# Patient Record
Sex: Male | Born: 1970 | Race: White | Hispanic: No | Marital: Married | Smoking: Never smoker
Health system: Southern US, Community
[De-identification: ages and names within clinical notes are randomized; demographics above are authoritative.]

## PROBLEM LIST (undated history)

## (undated) DIAGNOSIS — K219 Gastro-esophageal reflux disease without esophagitis: Secondary | ICD-10-CM

## (undated) DIAGNOSIS — I10 Essential (primary) hypertension: Secondary | ICD-10-CM

## (undated) DIAGNOSIS — T7840XA Allergy, unspecified, initial encounter: Secondary | ICD-10-CM

## (undated) HISTORY — DX: Allergy, unspecified, initial encounter: T78.40XA

## (undated) HISTORY — PX: LASIK: SHX215

## (undated) HISTORY — PX: VASECTOMY: SHX75

## (undated) HISTORY — DX: Gastro-esophageal reflux disease without esophagitis: K21.9

## (undated) HISTORY — PX: TONSILLECTOMY: SUR1361

## (undated) HISTORY — DX: Essential (primary) hypertension: I10

---

## 2016-11-09 ENCOUNTER — Telehealth: Payer: Self-pay | Admitting: Nurse Practitioner

## 2016-11-09 NOTE — Telephone Encounter (Signed)
Routed message to MMM to approve.

## 2016-11-09 NOTE — Telephone Encounter (Signed)
Appt made to be seen for New Patient/CPE per MMM

## 2016-11-09 NOTE — Telephone Encounter (Signed)
Yes ok 

## 2016-11-09 NOTE — Telephone Encounter (Signed)
Yes ok to make appointment for husband

## 2016-11-22 ENCOUNTER — Encounter: Payer: Self-pay | Admitting: Nurse Practitioner

## 2016-11-22 ENCOUNTER — Ambulatory Visit (INDEPENDENT_AMBULATORY_CARE_PROVIDER_SITE_OTHER): Payer: Managed Care, Other (non HMO) | Admitting: Nurse Practitioner

## 2016-11-22 ENCOUNTER — Ambulatory Visit (INDEPENDENT_AMBULATORY_CARE_PROVIDER_SITE_OTHER): Payer: Managed Care, Other (non HMO)

## 2016-11-22 ENCOUNTER — Other Ambulatory Visit: Payer: Self-pay | Admitting: Nurse Practitioner

## 2016-11-22 VITALS — BP 144/89 | HR 59 | Temp 97.4°F | Ht 71.0 in | Wt 195.0 lb

## 2016-11-22 DIAGNOSIS — Z23 Encounter for immunization: Secondary | ICD-10-CM

## 2016-11-22 DIAGNOSIS — Z0000 Encounter for general adult medical examination without abnormal findings: Secondary | ICD-10-CM

## 2016-11-22 DIAGNOSIS — I10 Essential (primary) hypertension: Secondary | ICD-10-CM | POA: Diagnosis not present

## 2016-11-22 DIAGNOSIS — Z8639 Personal history of other endocrine, nutritional and metabolic disease: Secondary | ICD-10-CM

## 2016-11-22 NOTE — Addendum Note (Signed)
Addended by: Cleda Daub on: 11/22/2016 03:06 PM   Modules accepted: Orders

## 2016-11-22 NOTE — Patient Instructions (Signed)
Hypertension °Hypertension is another name for high blood pressure. High blood pressure forces your heart to work harder to pump blood. This can cause problems over time. °There are two numbers in a blood pressure reading. There is a top number (systolic) over a bottom number (diastolic). It is best to have a blood pressure below 120/80. Healthy choices can help lower your blood pressure. You may need medicine to help lower your blood pressure if: °· Your blood pressure cannot be lowered with healthy choices. °· Your blood pressure is higher than 130/80. °Follow these instructions at home: °Eating and drinking  °· If directed, follow the DASH eating plan. This diet includes: °¨ Filling half of your plate at each meal with fruits and vegetables. °¨ Filling one quarter of your plate at each meal with whole grains. Whole grains include whole wheat pasta, brown rice, and whole grain bread. °¨ Eating or drinking low-fat dairy products, such as skim milk or low-fat yogurt. °¨ Filling one quarter of your plate at each meal with low-fat (lean) proteins. Low-fat proteins include fish, skinless chicken, eggs, beans, and tofu. °¨ Avoiding fatty meat, cured and processed meat, or chicken with skin. °¨ Avoiding premade or processed food. °· Eat less than 1,500 mg of salt (sodium) a day. °· Limit alcohol use to no more than 1 drink a day for nonpregnant women and 2 drinks a day for men. One drink equals 12 oz of beer, 5 oz of wine, or 1½ oz of hard liquor. °Lifestyle  °· Work with your doctor to stay at a healthy weight or to lose weight. Ask your doctor what the best weight is for you. °· Get at least 30 minutes of exercise that causes your heart to beat faster (aerobic exercise) most days of the week. This may include walking, swimming, or biking. °· Get at least 30 minutes of exercise that strengthens your muscles (resistance exercise) at least 3 days a week. This may include lifting weights or pilates. °· Do not use any  products that contain nicotine or tobacco. This includes cigarettes and e-cigarettes. If you need help quitting, ask your doctor. °· Check your blood pressure at home as told by your doctor. °· Keep all follow-up visits as told by your doctor. This is important. °Medicines  °· Take over-the-counter and prescription medicines only as told by your doctor. Follow directions carefully. °· Do not skip doses of blood pressure medicine. The medicine does not work as well if you skip doses. Skipping doses also puts you at risk for problems. °· Ask your doctor about side effects or reactions to medicines that you should watch for. °Contact a doctor if: °· You think you are having a reaction to the medicine you are taking. °· You have headaches that keep coming back (recurring). °· You feel dizzy. °· You have swelling in your ankles. °· You have trouble with your vision. °Get help right away if: °· You get a very bad headache. °· You start to feel confused. °· You feel weak or numb. °· You feel faint. °· You get very bad pain in your: °¨ Chest. °¨ Belly (abdomen). °· You throw up (vomit) more than once. °· You have trouble breathing. °Summary °· Hypertension is another name for high blood pressure. °· Making healthy choices can help lower blood pressure. If your blood pressure cannot be controlled with healthy choices, you may need to take medicine. °This information is not intended to replace advice given to you by your   health care provider. Make sure you discuss any questions you have with your health care provider. °Document Released: 12/28/2007 Document Revised: 06/08/2016 Document Reviewed: 06/08/2016 °Elsevier Interactive Patient Education © 2017 Elsevier Inc. ° °

## 2016-11-22 NOTE — Progress Notes (Addendum)
Subjective:    Patient ID: Kien Mirsky, male    DOB: Jun 18, 1971, 46 y.o.   MRN: [id number]  HPI Patient comes in today to establish care and annual physical. It has been several years since he has had a physical with blood work. He is very active and works out either [employer] or doing cardio everyday. He currently has no medical problems and is on no meds. C/O sharp chest pain couple of weeks ago- was not active at moment- lasted less then 1 minute- no SOB.  Has history of vitamin d def and takes multivitamin with vitamn d daily. Family History  Problem Relation Age of Onset  . Heart disease Mother   . Hyperlipidemia Mother   . Hypertension Mother   . Kidney disease Mother   . Heart disease Father   . Cancer Father   . Hyperlipidemia Father   . Diabetes Sister   . Hyperlipidemia Brother    Past Surgical History:  Procedure Laterality Date  . VASECTOMY     Social History   Social History  . Marital status: Married    Spouse name: N/A  . Number of children: N/A  . Years of education: N/A   Occupational History  . Not on file.   Social History Main Topics  . Smoking status: Never Smoker  . Smokeless tobacco: Never Used  . Alcohol use 7.2 oz/week    12 Cans of beer per week     Comment: weekends only  . Drug use: Unknown  . Sexual activity: Yes    Birth control/ protection: Surgical     Comment: vasectomy   Other Topics Concern  . Not on file   Social History Narrative  . No narrative on file     Review of Systems  Constitutional: Negative for diaphoresis.  Eyes: Negative for pain.  Respiratory: Negative for shortness of breath.   Cardiovascular: Negative for chest pain, palpitations and leg swelling.  Gastrointestinal: Negative for abdominal pain.  Endocrine: Negative for polydipsia.  Skin: Negative for rash.  Neurological: Negative for dizziness, weakness and headaches.  Hematological: Does not bruise/bleed easily.       Objective:   Physical Exam  Constitutional: He is oriented to person, place, and time. He appears well-developed and well-nourished.  HENT:  Head: Normocephalic.  Right Ear: External ear normal.  Left Ear: External ear normal.  Nose: Nose normal.  Mouth/Throat: Oropharynx is clear and moist.  Eyes: EOM are normal. Pupils are equal, round, and reactive to light.  Neck: Normal range of motion. Neck supple. No JVD present. No thyromegaly present.  Cardiovascular: Normal rate, regular rhythm, normal heart sounds and intact distal pulses.  Exam reveals no gallop and no friction rub.   No murmur heard. Pulmonary/Chest: Effort normal and breath sounds normal. No respiratory distress. He has no wheezes. He has no rales. He exhibits no tenderness.  Abdominal: Soft. Bowel sounds are normal. He exhibits no mass. There is no tenderness.  Genitourinary: Prostate normal and penis normal.  Musculoskeletal: Normal range of motion. He exhibits no edema.  Lymphadenopathy:    He has no cervical adenopathy.  Neurological: He is alert and oriented to person, place, and time. No cranial nerve deficit.  Skin: Skin is warm and dry.  Psychiatric: He has a normal mood and affect. His behavior is normal. Judgment and thought content normal.   BP (!) 144/89   Pulse (!) 59   Temp 97.4 F (36.3 C) (Oral)   Ht 5'  11" (1.803 m)   Wt 195 lb (88.5 kg)   BMI 27.20 kg/m    EKG- sinus bradycardia-Mary-Margaret Daphine Deutscher, FNP Chest x ray-    Assessment & Plan:   1. Annual physical exam    Labs pending Continue to watch diet and exercise Labs pending Wife will get blood pressure cuff and keep diary of blood pressure and will let me know next week what blood pressure is doing Needs eye exam RTO in 1 year- sooner if blood pressure is elevated at home  Mary-Margaret Daphine Deutscher, FNP

## 2016-11-23 LAB — CBC WITH DIFFERENTIAL/PLATELET
BASOS: 0 %
Basophils Absolute: 0 10*3/uL (ref 0.0–0.2)
EOS (ABSOLUTE): 0.1 10*3/uL (ref 0.0–0.4)
Eos: 2 %
Hematocrit: 45.1 % (ref 37.5–51.0)
Hemoglobin: 15.2 g/dL (ref 13.0–17.7)
IMMATURE GRANULOCYTES: 0 %
Immature Grans (Abs): 0 10*3/uL (ref 0.0–0.1)
Lymphocytes Absolute: 1.8 10*3/uL (ref 0.7–3.1)
Lymphs: 21 %
MCH: 30 pg (ref 26.6–33.0)
MCHC: 33.7 g/dL (ref 31.5–35.7)
MCV: 89 fL (ref 79–97)
MONOS ABS: 0.5 10*3/uL (ref 0.1–0.9)
Monocytes: 5 %
NEUTROS PCT: 72 %
Neutrophils Absolute: 6.1 10*3/uL (ref 1.4–7.0)
PLATELETS: 224 10*3/uL (ref 150–379)
RBC: 5.07 x10E6/uL (ref 4.14–5.80)
RDW: 13.7 % (ref 12.3–15.4)
WBC: 8.5 10*3/uL (ref 3.4–10.8)

## 2016-11-23 LAB — LIPID PANEL
CHOL/HDL RATIO: 2.3 ratio (ref 0.0–5.0)
CHOLESTEROL TOTAL: 115 mg/dL (ref 100–199)
HDL: 50 mg/dL (ref >39)
LDL Calculated: 54 mg/dL (ref 0–99)
TRIGLYCERIDES: 53 mg/dL (ref 0–149)
VLDL Cholesterol Cal: 11 mg/dL (ref 5–40)

## 2016-11-23 LAB — CMP14+EGFR
A/G RATIO: 2 (ref 1.2–2.2)
ALK PHOS: 37 IU/L — AB (ref 39–117)
ALT: 21 IU/L (ref 0–44)
AST: 16 IU/L (ref 0–40)
Albumin: 4.5 g/dL (ref 3.5–5.5)
BUN/Creatinine Ratio: 17 (ref 9–20)
BUN: 17 mg/dL (ref 6–24)
Bilirubin Total: 0.9 mg/dL (ref 0.0–1.2)
CALCIUM: 9.6 mg/dL (ref 8.7–10.2)
CO2: 27 mmol/L (ref 18–29)
Chloride: 98 mmol/L (ref 96–106)
Creatinine, Ser: 1 mg/dL (ref 0.76–1.27)
GFR calc Af Amer: 103 mL/min/{1.73_m2} (ref >59)
GFR, EST NON AFRICAN AMERICAN: 89 mL/min/{1.73_m2} (ref >59)
Globulin, Total: 2.2 g/dL (ref 1.5–4.5)
Glucose: 91 mg/dL (ref 65–99)
POTASSIUM: 4.2 mmol/L (ref 3.5–5.2)
Sodium: 142 mmol/L (ref 134–144)
Total Protein: 6.7 g/dL (ref 6.0–8.5)

## 2016-11-23 LAB — PSA, TOTAL AND FREE
PSA, Free Pct: 22.9 %
PSA, Free: 0.16 ng/mL
Prostate Specific Ag, Serum: 0.7 ng/mL (ref 0.0–4.0)

## 2016-11-23 LAB — VITAMIN D 25 HYDROXY (VIT D DEFICIENCY, FRACTURES): VIT D 25 HYDROXY: 41.9 ng/mL (ref 30.0–100.0)

## 2018-01-27 IMAGING — DX DG CHEST 2V
2 series · 2 of 2 positions shown · non-contrast
Comparison: None.

CLINICAL DATA: Hypertension

EXAM:
CHEST  2 VIEW

[chest pa]
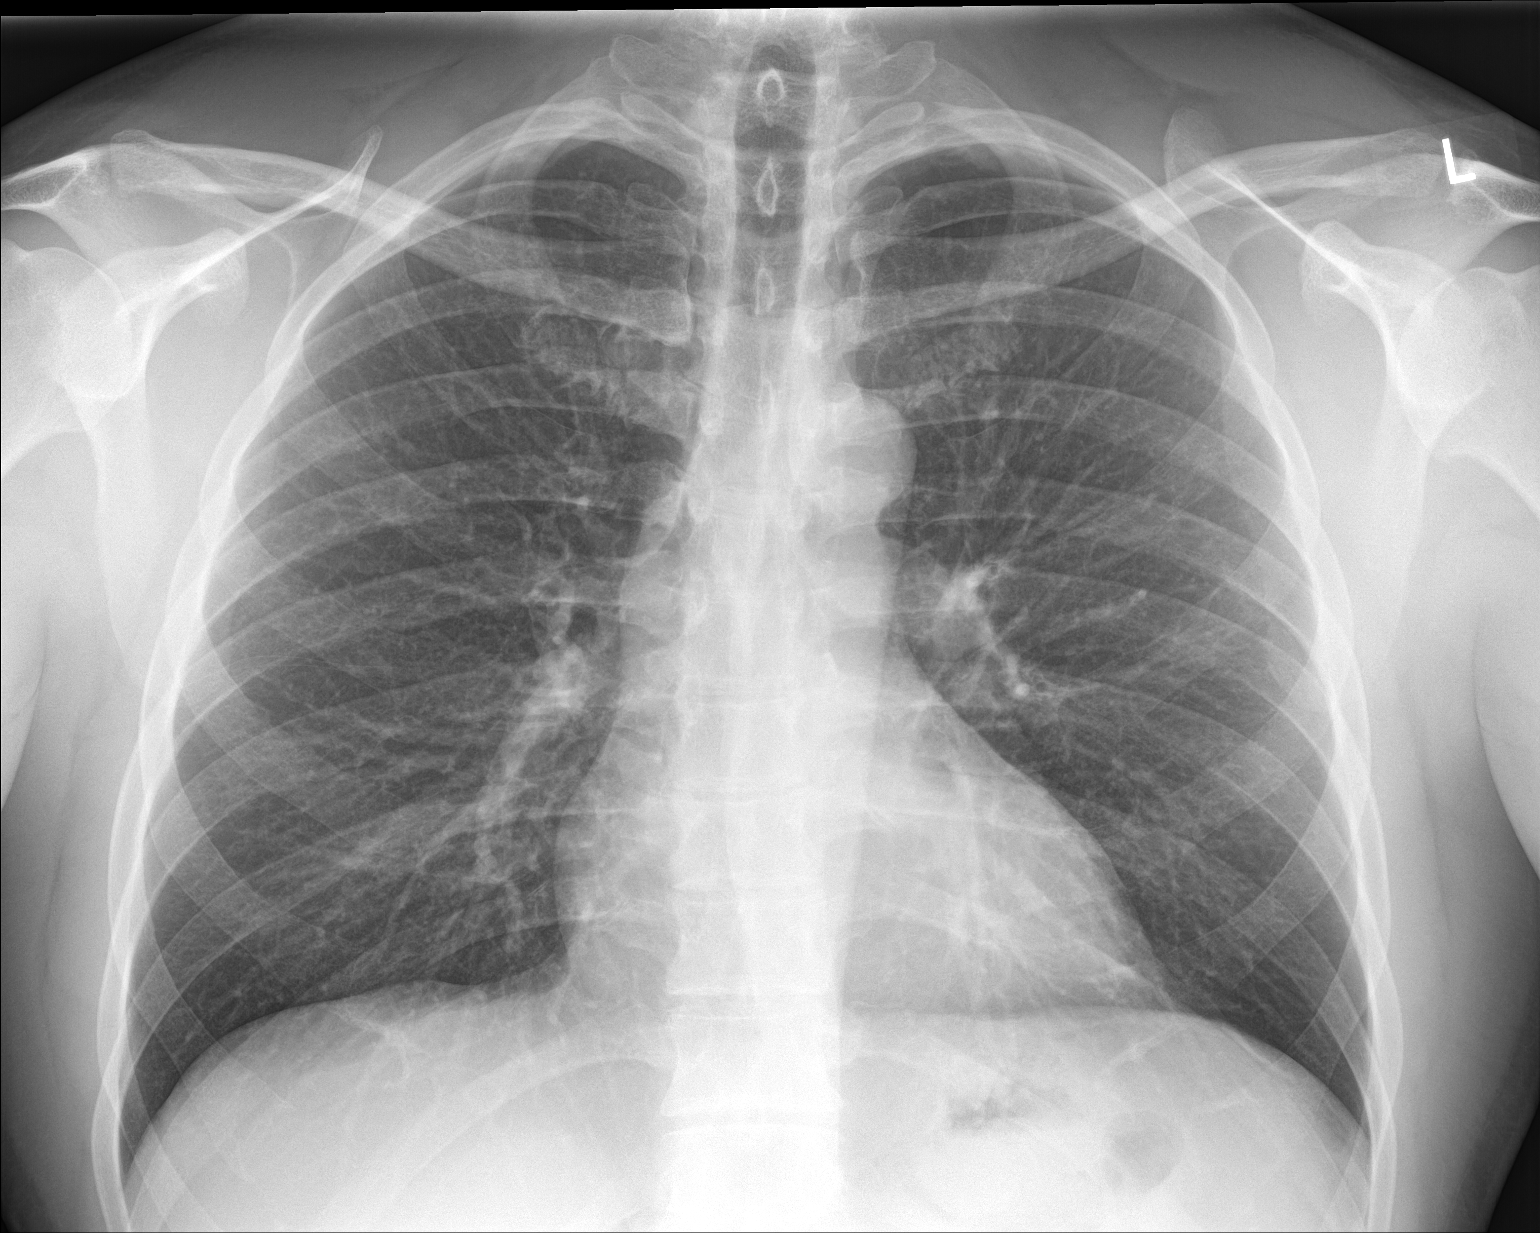

[chest lat]
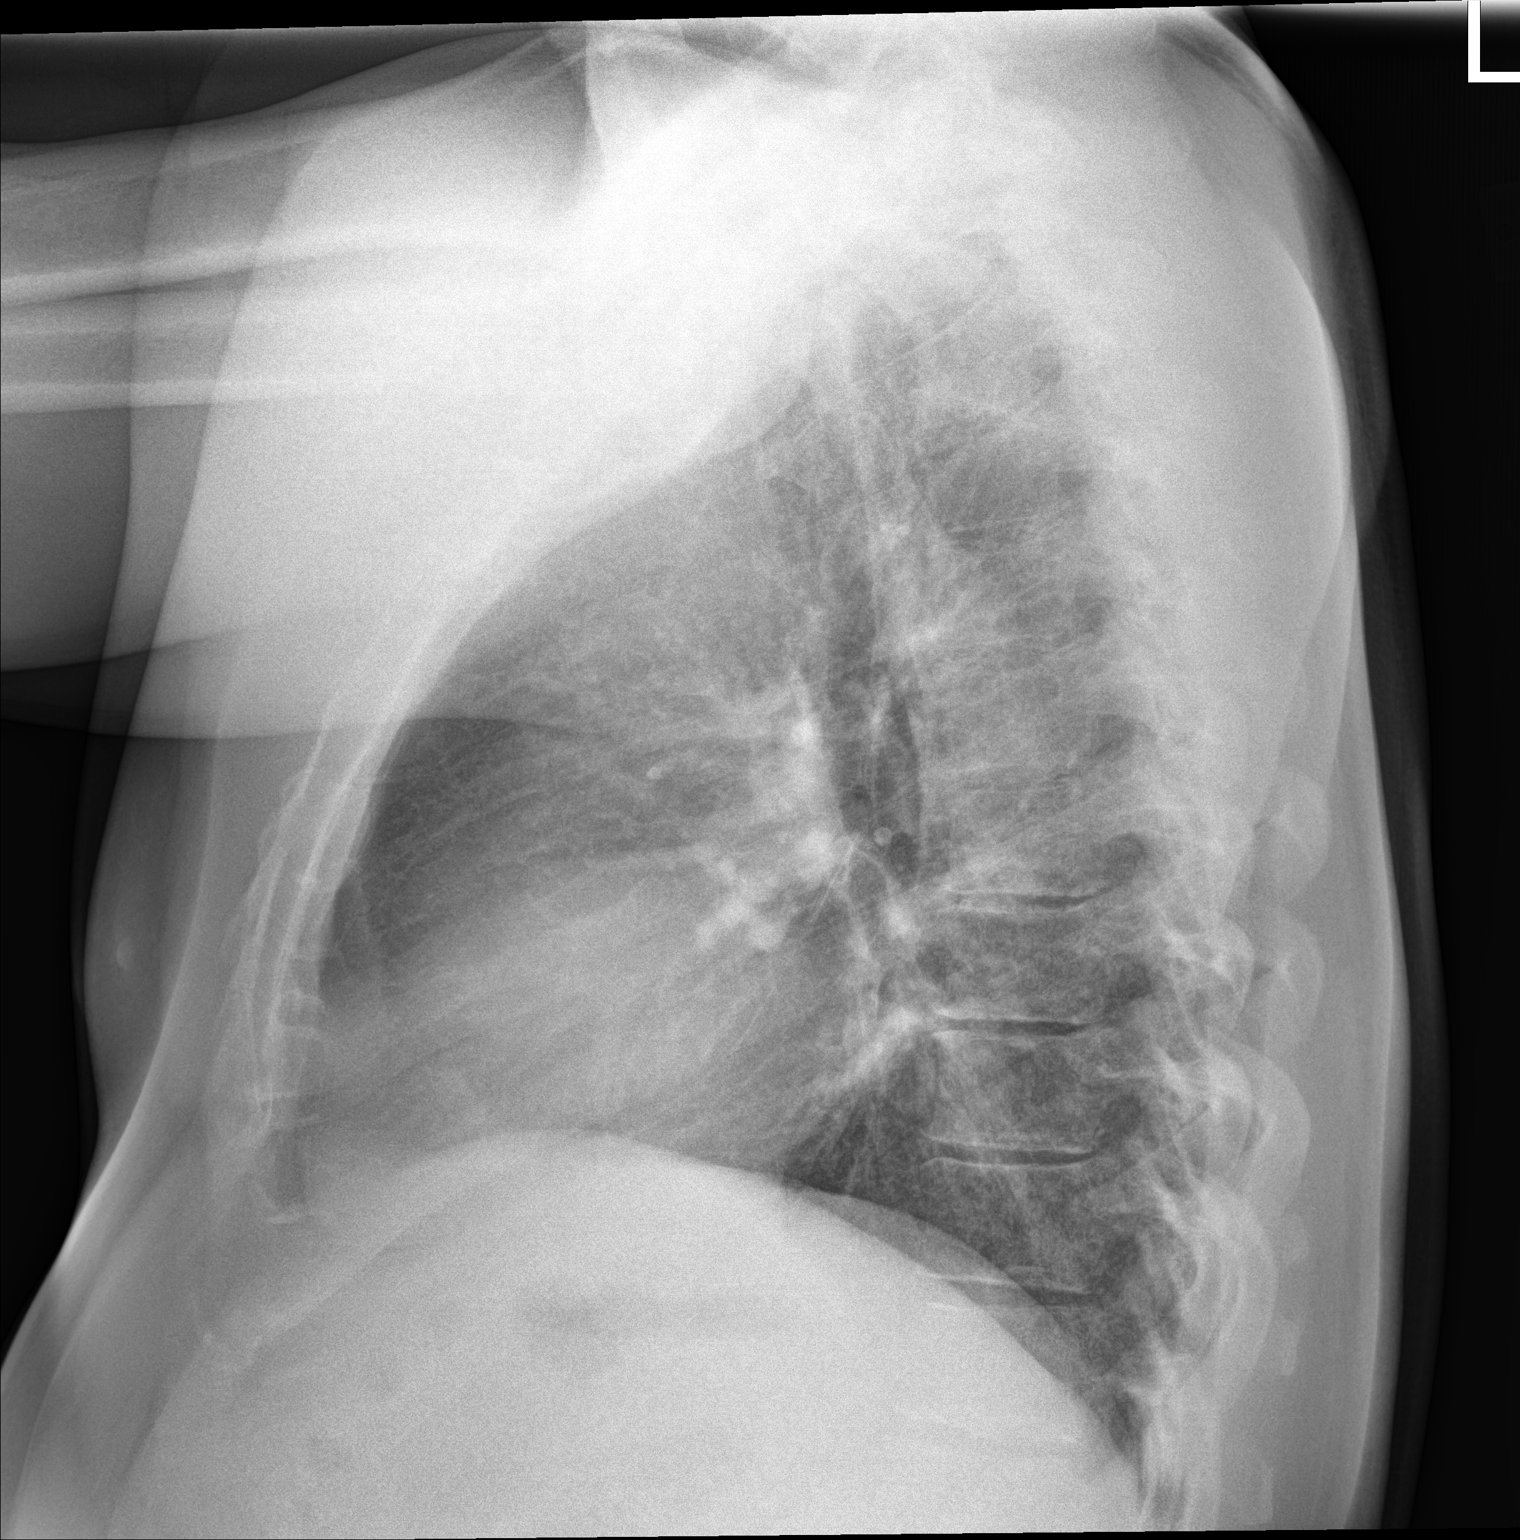

[2 of 2 positions shown; findings below may reference images not displayed]

FINDINGS: Heart size is normal. Mediastinal shadows are normal. The lungs are
clear. No bronchial thickening. No infiltrate, mass, effusion or
collapse. Pulmonary vascularity is normal. No bony abnormality.
IMPRESSION: Normal chest

## 2018-03-01 ENCOUNTER — Telehealth: Payer: Self-pay | Admitting: *Deleted

## 2018-03-01 NOTE — Telephone Encounter (Signed)
Left message for patient to schedule annual exam

## 2019-08-23 ENCOUNTER — Ambulatory Visit (INDEPENDENT_AMBULATORY_CARE_PROVIDER_SITE_OTHER): Payer: Managed Care, Other (non HMO) | Admitting: Nurse Practitioner

## 2019-08-23 ENCOUNTER — Other Ambulatory Visit: Payer: Self-pay

## 2019-08-23 ENCOUNTER — Encounter: Payer: Self-pay | Admitting: Nurse Practitioner

## 2019-08-23 ENCOUNTER — Ambulatory Visit (INDEPENDENT_AMBULATORY_CARE_PROVIDER_SITE_OTHER): Payer: Managed Care, Other (non HMO)

## 2019-08-23 VITALS — BP 144/88 | HR 70 | Temp 98.6°F | Resp 20 | Ht 71.0 in | Wt 204.0 lb

## 2019-08-23 DIAGNOSIS — Z0000 Encounter for general adult medical examination without abnormal findings: Secondary | ICD-10-CM

## 2019-08-23 NOTE — Patient Instructions (Signed)
Exercising to Stay Healthy To become healthy and stay healthy, it is recommended that you do moderate-intensity and vigorous-intensity exercise. You can tell that you are exercising at a moderate intensity if your heart starts beating faster and you start breathing faster but can still hold a conversation. You can tell that you are exercising at a vigorous intensity if you are breathing much harder and faster and cannot hold a conversation while exercising. Exercising regularly is important. It has many health benefits, such as:  Improving overall fitness, flexibility, and endurance.  Increasing bone density.  Helping with weight control.  Decreasing body fat.  Increasing muscle strength.  Reducing stress and tension.  Improving overall health. How often should I exercise? Choose an activity that you enjoy, and set realistic goals. Your health care provider can help you make an activity plan that works for you. Exercise regularly as told by your health care provider. This may include:  Doing strength training two times a week, such as: ? Lifting weights. ? Using resistance bands. ? Push-ups. ? Sit-ups. ? Yoga.  Doing a certain intensity of exercise for a given amount of time. Choose from these options: ? A total of 150 minutes of moderate-intensity exercise every week. ? A total of 75 minutes of vigorous-intensity exercise every week. ? A mix of moderate-intensity and vigorous-intensity exercise every week. Children, pregnant women, people who have not exercised regularly, people who are overweight, and older adults may need to talk with a health care provider about what activities are safe to do. If you have a medical condition, be sure to talk with your health care provider before you start a new exercise program. What are some exercise ideas? Moderate-intensity exercise ideas include:  Walking 1 mile (1.6 km) in about 15  minutes.  Biking.  Hiking.  Golfing.  Dancing.  Water aerobics. Vigorous-intensity exercise ideas include:  Walking 4.5 miles (7.2 km) or more in about 1 hour.  Jogging or running 5 miles (8 km) in about 1 hour.  Biking 10 miles (16.1 km) or more in about 1 hour.  Lap swimming.  Roller-skating or in-line skating.  Cross-country skiing.  Vigorous competitive sports, such as football, basketball, and soccer.  Jumping rope.  Aerobic dancing. What are some everyday activities that can help me to get exercise?  Yard work, such as: ? Pushing a lawn mower. ? Raking and bagging leaves.  Washing your car.  Pushing a stroller.  Shoveling snow.  Gardening.  Washing windows or floors. How can I be more active in my day-to-day activities?  Use stairs instead of an elevator.  Take a walk during your lunch break.  If you drive, park your car farther away from your work or school.  If you take public transportation, get off one stop early and walk the rest of the way.  Stand up or walk around during all of your indoor phone calls.  Get up, stretch, and walk around every 30 minutes throughout the day.  Enjoy exercise with a friend. Support to continue exercising will help you keep a regular routine of activity. What guidelines can I follow while exercising?  Before you start a new exercise program, talk with your health care provider.  Do not exercise so much that you hurt yourself, feel dizzy, or get very short of breath.  Wear comfortable clothes and wear shoes with good support.  Drink plenty of water while you exercise to prevent dehydration or heat stroke.  Work out until your breathing   and your heartbeat get faster. Where to find more information  [place] Department of Health and Human Services: www.hhs.gov  Centers for Disease Control and Prevention (CDC): www.cdc.gov Summary  Exercising regularly is important. It will improve your overall fitness,  flexibility, and endurance.  Regular exercise also will improve your overall health. It can help you control your weight, reduce stress, and improve your bone density.  Do not exercise so much that you hurt yourself, feel dizzy, or get very short of breath.  Before you start a new exercise program, talk with your health care provider. This information is not intended to replace advice given to you by your health care provider. Make sure you discuss any questions you have with your health care provider. Document Revised: 06/23/2017 Document Reviewed: 06/01/2017 Elsevier Patient Education  2020 Elsevier Inc.  

## 2019-08-23 NOTE — Progress Notes (Addendum)
Subjective:    Patient ID: Joel Gray, male    DOB: 1971-05-08, 49 y.o.   MRN: [id number]   Chief Complaint: Annual Exam    HPI:  1. Annual physical exam No health complaints. Occasional heartburn that is relieved with Tums. Exercises about 5 days a week. Tries to watch diet. No medical problems. No prescribed medications.    No outpatient encounter medications on file as of 08/23/2019.   No facility-administered encounter medications on file as of 08/23/2019.    Past Surgical History:  Procedure Laterality Date  . VASECTOMY      Family History  Problem Relation Age of Onset  . Heart disease Mother   . Hyperlipidemia Mother   . Hypertension Mother   . Kidney disease Mother   . Heart disease Father   . Cancer Father   . Hyperlipidemia Father   . Diabetes Sister   . Hyperlipidemia Brother     New complaints: None.  Social history: Lives with wife and 2 children.  Controlled substance contract: n/a     Review of Systems  Constitutional: Negative for diaphoresis.  Eyes: Negative for pain.  Respiratory: Negative for shortness of breath.   Cardiovascular: Negative for chest pain, palpitations and leg swelling.  Gastrointestinal: Negative for abdominal pain.  Endocrine: Negative for polydipsia.  Skin: Negative for rash.  Neurological: Negative for dizziness, weakness and headaches.  Hematological: Does not bruise/bleed easily.  All other systems reviewed and are negative.      Objective:   Physical Exam Vitals and nursing note reviewed.  Constitutional:      Appearance: Normal appearance. He is well-developed.  HENT:     Head: Normocephalic.     Nose: Nose normal.  Eyes:     Pupils: Pupils are equal, round, and reactive to light.  Neck:     Thyroid: No thyroid mass or thyromegaly.     Vascular: No carotid bruit or JVD.     Trachea: Phonation normal.  Cardiovascular:     Rate and Rhythm: Normal rate and regular rhythm.  Pulmonary:   Effort: Pulmonary effort is normal. No respiratory distress.     Breath sounds: Normal breath sounds.  Abdominal:     General: Bowel sounds are normal.     Palpations: Abdomen is soft.     Tenderness: There is no abdominal tenderness.  Musculoskeletal:        General: Normal range of motion.     Cervical back: Normal range of motion and neck supple.  Lymphadenopathy:     Cervical: No cervical adenopathy.  Skin:    General: Skin is warm and dry.  Neurological:     Mental Status: He is alert and oriented to person, place, and time.  Psychiatric:        Behavior: Behavior normal.        Thought Content: Thought content normal.        Judgment: Judgment normal.     BP (!) 144/88   Pulse 70   Temp 98.6 F (37 C) (Temporal)   Resp 20   Ht 5' 11"  (1.803 m)   Wt 204 lb (92.5 kg)   SpO2 98%   BMI 28.45 kg/m   EKG-NSR->mmms Chest xray no cardiopulmonary abnormalities-Preliminary reading by Ronnald Collum, FNP  [facility]      Assessment & Plan:  Joel Gray in today with chief complaint of Annual Exam   1. Annual physical exam Continue to exercise - EKG 12-Lead - DG Chest 2 View;  Future - CMP14+EGFR - Lipid panel - CBC with Differential/Platelet - PSA, total and free    Joel Hassell Done, FNP

## 2019-08-24 LAB — CMP14+EGFR
ALT: 27 IU/L (ref 0–44)
AST: 24 IU/L (ref 0–40)
Albumin/Globulin Ratio: 2.6 — ABNORMAL HIGH (ref 1.2–2.2)
Albumin: 4.9 g/dL (ref 4.0–5.0)
Alkaline Phosphatase: 40 IU/L (ref 39–117)
BUN/Creatinine Ratio: 18 (ref 9–20)
BUN: 19 mg/dL (ref 6–24)
Bilirubin Total: 0.8 mg/dL (ref 0.0–1.2)
CO2: 24 mmol/L (ref 20–29)
Calcium: 9.3 mg/dL (ref 8.7–10.2)
Chloride: 103 mmol/L (ref 96–106)
Creatinine, Ser: 1.07 mg/dL (ref 0.76–1.27)
GFR calc Af Amer: 93 mL/min/{1.73_m2} (ref >59)
GFR calc non Af Amer: 81 mL/min/{1.73_m2} (ref >59)
Globulin, Total: 1.9 g/dL (ref 1.5–4.5)
Glucose: 105 mg/dL — ABNORMAL HIGH (ref 65–99)
Potassium: 4.4 mmol/L (ref 3.5–5.2)
Sodium: 139 mmol/L (ref 134–144)
Total Protein: 6.8 g/dL (ref 6.0–8.5)

## 2019-08-24 LAB — CBC WITH DIFFERENTIAL/PLATELET
Basophils Absolute: 0 10*3/uL (ref 0.0–0.2)
Basos: 0 %
EOS (ABSOLUTE): 0.1 10*3/uL (ref 0.0–0.4)
Eos: 2 %
Hematocrit: 47 % (ref 37.5–51.0)
Hemoglobin: 16.2 g/dL (ref 13.0–17.7)
Immature Grans (Abs): 0 10*3/uL (ref 0.0–0.1)
Immature Granulocytes: 0 %
Lymphocytes Absolute: 1.4 10*3/uL (ref 0.7–3.1)
Lymphs: 20 %
MCH: 31 pg (ref 26.6–33.0)
MCHC: 34.5 g/dL (ref 31.5–35.7)
MCV: 90 fL (ref 79–97)
Monocytes Absolute: 0.6 10*3/uL (ref 0.1–0.9)
Monocytes: 8 %
Neutrophils Absolute: 4.9 10*3/uL (ref 1.4–7.0)
Neutrophils: 70 %
Platelets: 235 10*3/uL (ref 150–450)
RBC: 5.22 x10E6/uL (ref 4.14–5.80)
RDW: 12.4 % (ref 11.6–15.4)
WBC: 7 10*3/uL (ref 3.4–10.8)

## 2019-08-24 LAB — LIPID PANEL
Chol/HDL Ratio: 2.4 ratio (ref 0.0–5.0)
Cholesterol, Total: 117 mg/dL (ref 100–199)
HDL: 48 mg/dL (ref >39)
LDL Chol Calc (NIH): 58 mg/dL (ref 0–99)
Triglycerides: 43 mg/dL (ref 0–149)
VLDL Cholesterol Cal: 11 mg/dL (ref 5–40)

## 2019-08-24 LAB — PSA, TOTAL AND FREE
PSA, Free Pct: 23.8 %
PSA, Free: 0.19 ng/mL
Prostate Specific Ag, Serum: 0.8 ng/mL (ref 0.0–4.0)

## 2019-09-27 ENCOUNTER — Ambulatory Visit: Payer: Managed Care, Other (non HMO) | Admitting: Nurse Practitioner

## 2020-10-27 IMAGING — DX DG CHEST 2V
3 series · 3 of 3 positions shown · non-contrast
Comparison: 11/22/2016

CLINICAL DATA: Annual physical exam.

EXAM:
CHEST - 2 VIEW

[chest ap]
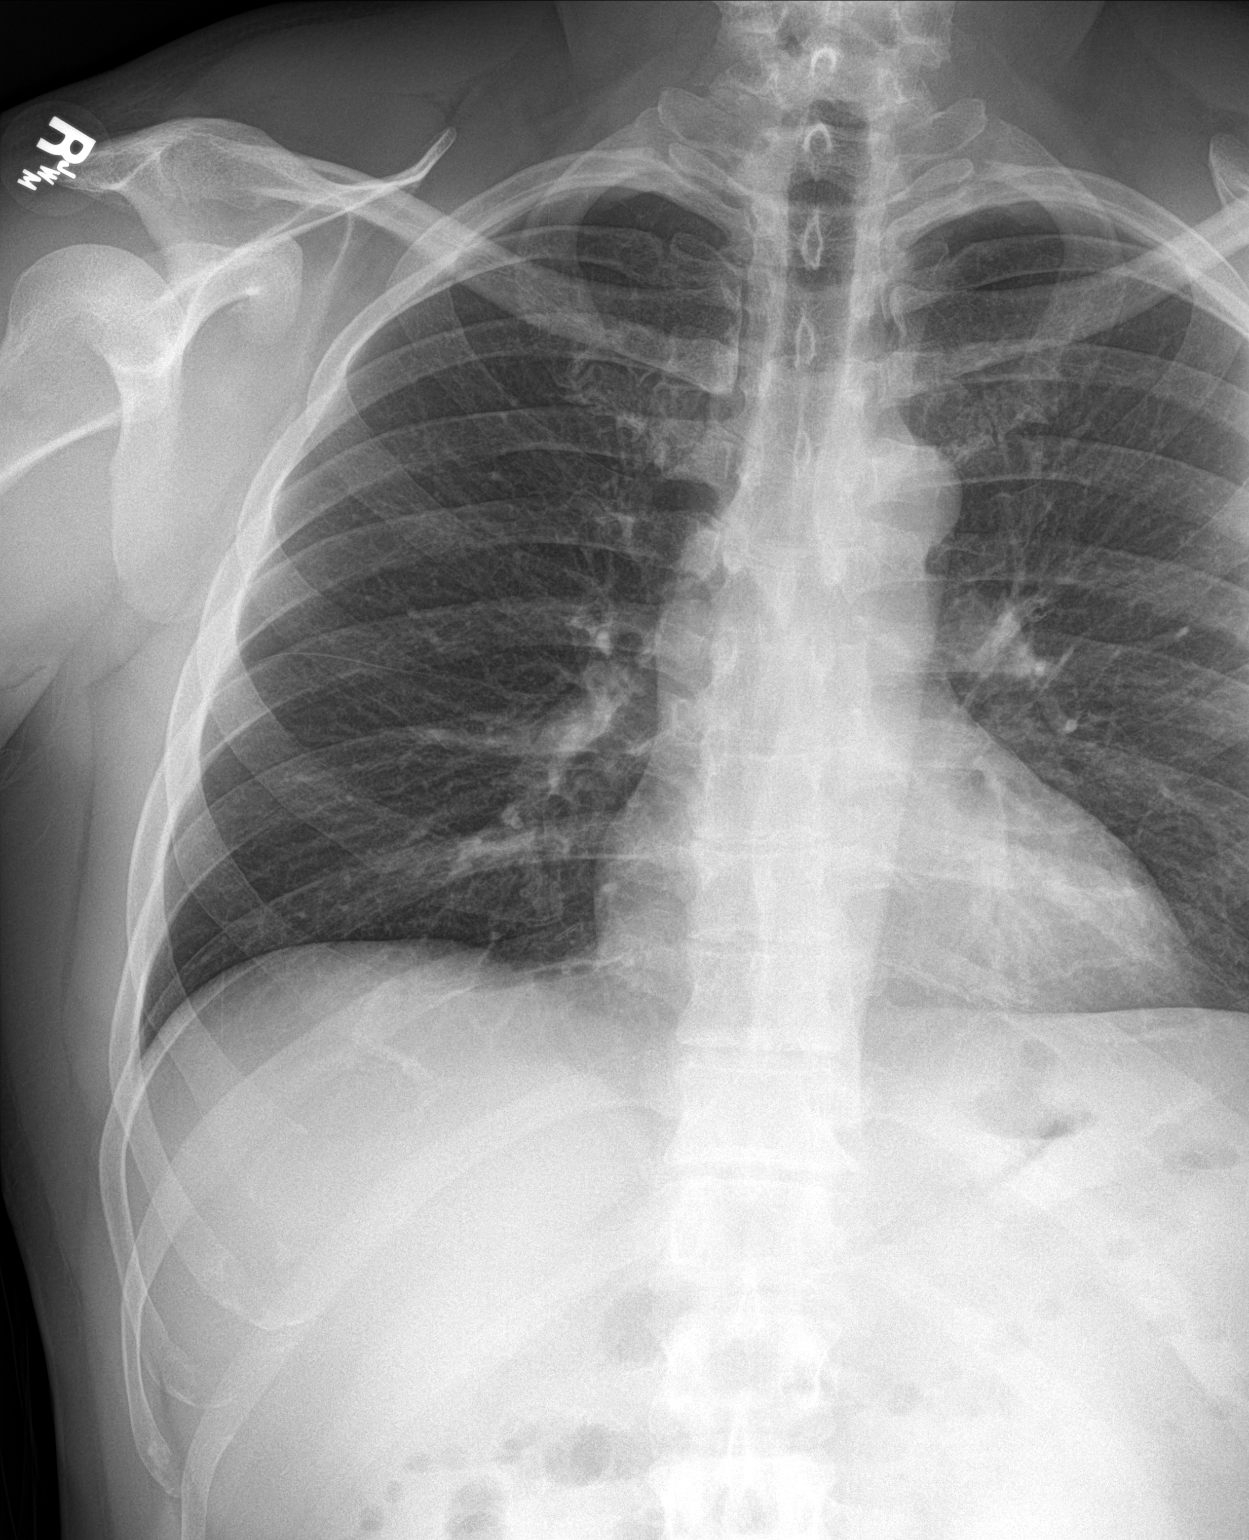

[chest pa]
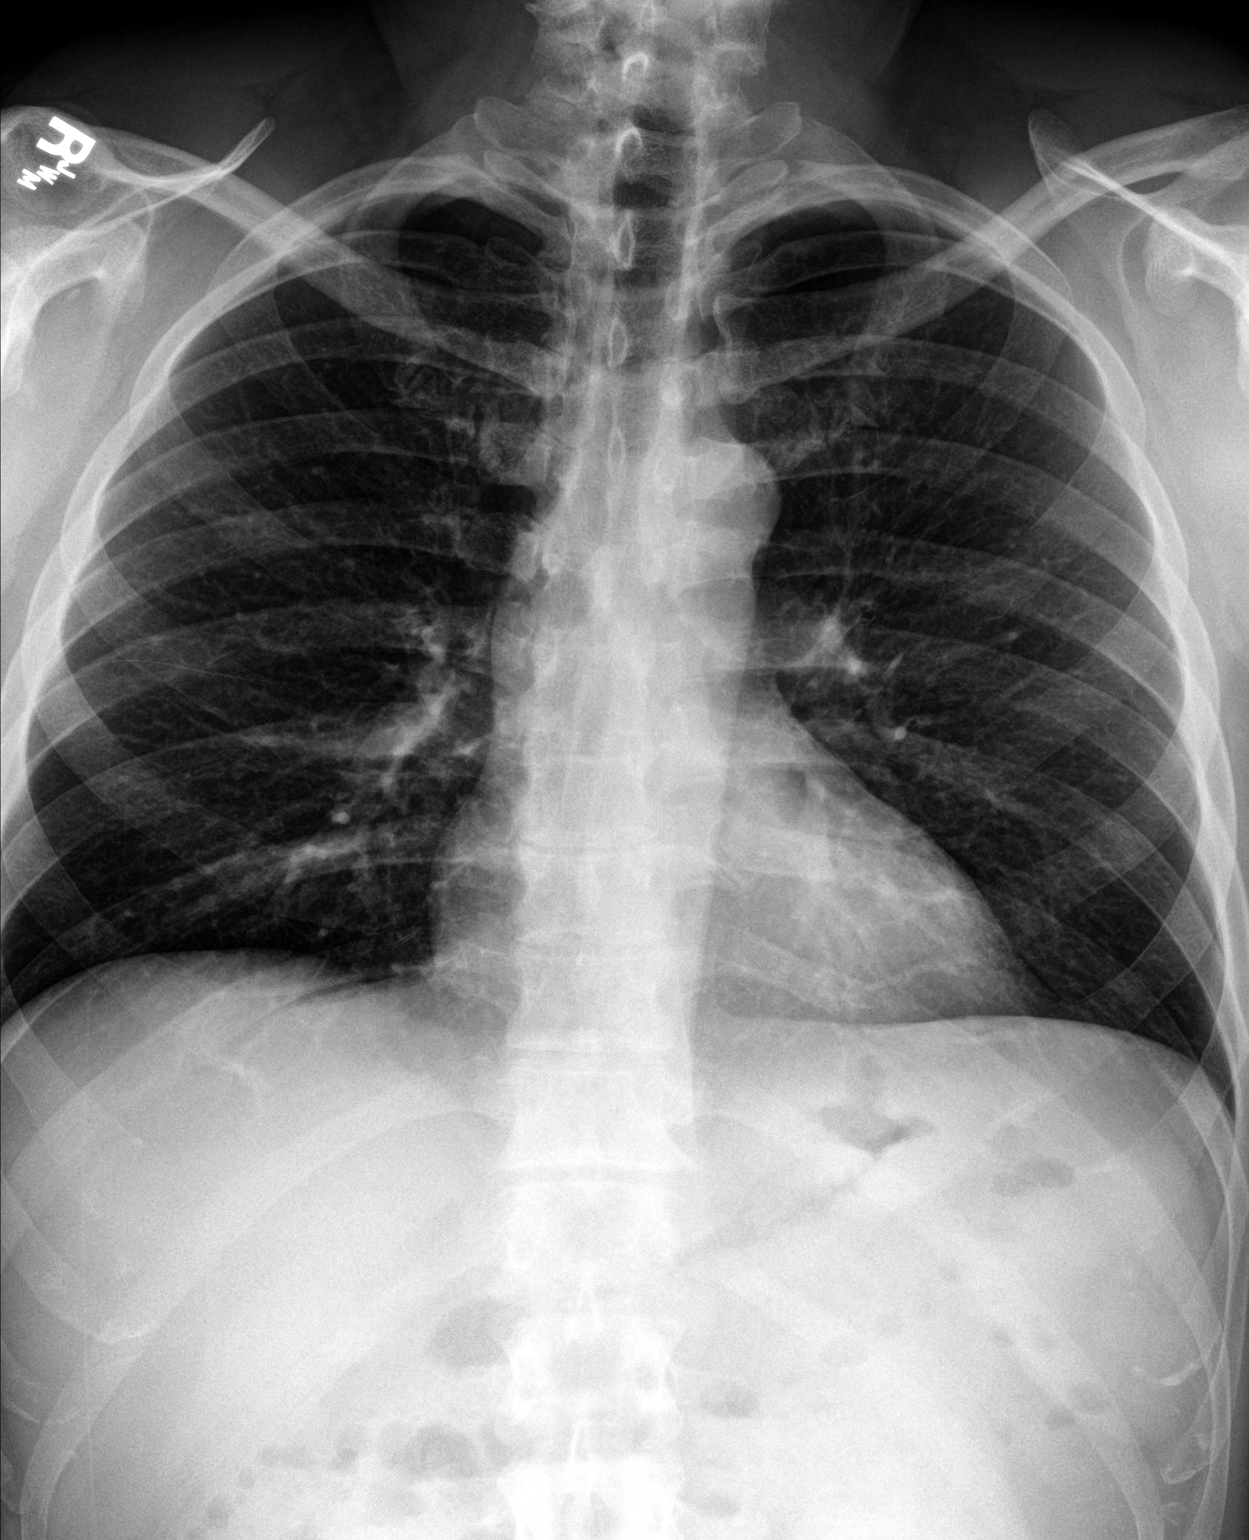

[chest lat]
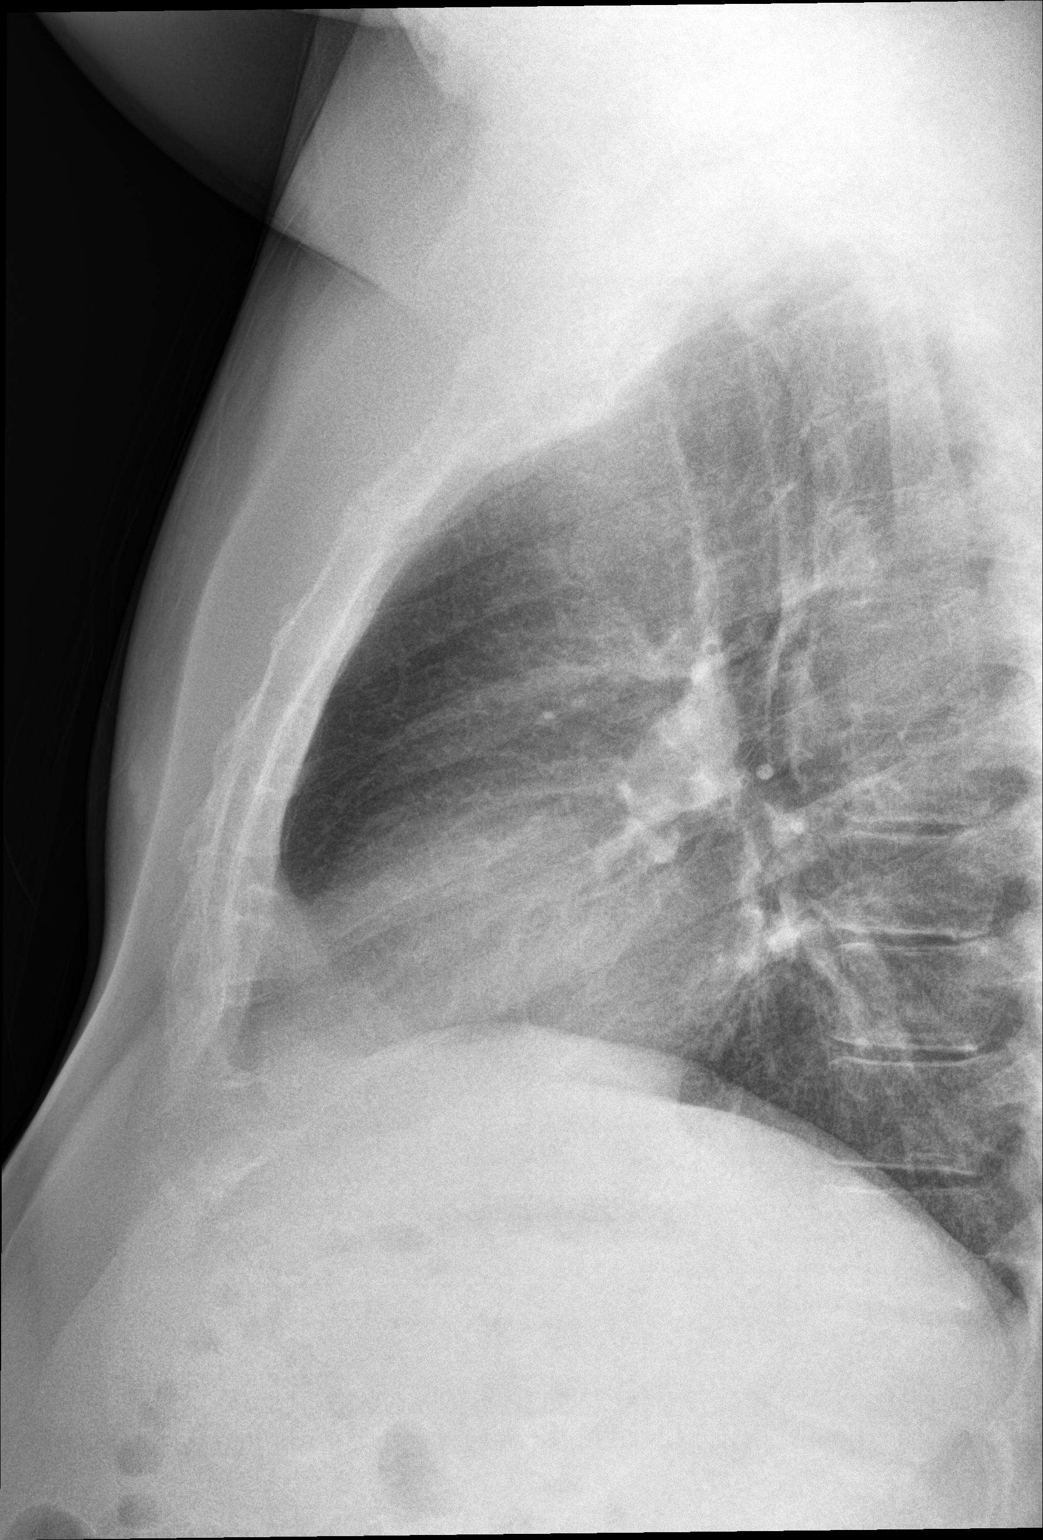

[3 of 3 positions shown; findings below may reference images not displayed]

FINDINGS: The heart size and mediastinal contours are within normal limits.
Both lungs are clear. The visualized skeletal structures are
unremarkable.
IMPRESSION: No active cardiopulmonary disease.

## 2021-03-05 ENCOUNTER — Other Ambulatory Visit: Payer: Self-pay

## 2021-03-05 ENCOUNTER — Encounter: Payer: Self-pay | Admitting: Nurse Practitioner

## 2021-03-05 ENCOUNTER — Ambulatory Visit (INDEPENDENT_AMBULATORY_CARE_PROVIDER_SITE_OTHER): Payer: Managed Care, Other (non HMO) | Admitting: Nurse Practitioner

## 2021-03-05 VITALS — BP 152/87 | HR 59 | Temp 98.2°F | Resp 20 | Ht 71.0 in | Wt 202.0 lb

## 2021-03-05 DIAGNOSIS — R079 Chest pain, unspecified: Secondary | ICD-10-CM | POA: Diagnosis not present

## 2021-03-05 DIAGNOSIS — Z125 Encounter for screening for malignant neoplasm of prostate: Secondary | ICD-10-CM

## 2021-03-05 DIAGNOSIS — Z1211 Encounter for screening for malignant neoplasm of colon: Secondary | ICD-10-CM

## 2021-03-05 DIAGNOSIS — I10 Essential (primary) hypertension: Secondary | ICD-10-CM | POA: Diagnosis not present

## 2021-03-05 DIAGNOSIS — Z1212 Encounter for screening for malignant neoplasm of rectum: Secondary | ICD-10-CM

## 2021-03-05 MED ORDER — LISINOPRIL 20 MG PO TABS: 20.0000 mg | ORAL_TABLET | Freq: Every day | ORAL | 1 refills | Status: DC

## 2021-03-05 NOTE — Addendum Note (Signed)
Addended by: Bennie Pierini on: 03/05/2021 02:44 PM   Modules accepted: Orders

## 2021-03-05 NOTE — Progress Notes (Signed)
Subjective:    Patient ID: Joel Gray, male    DOB: 1971/01/28, 50 y.o.   MRN: [id number]   Chief Complaint: Chest Pain   HPI Patient was says he has been having chest pain. Has happened several episodes, lasting less then a minute. Pain feels like a sharp pain that stays in left side of chest. ( He has numbness in his left shoulder all the time anyway.)  he denies any sob , fatigue or sweating. He works out either at gym or runs everyday. He is very strict about his diet as well.  BP Readings from Last 3 Encounters:  03/05/21 (!) 152/87  08/23/19 (!) 144/88  11/22/16 (!) 144/89    Wt Readings from Last 3 Encounters:  03/05/21 202 lb (91.6 kg)  08/23/19 204 lb (92.5 kg)  11/22/16 195 lb (88.5 kg)   BMI Readings from Last 3 Encounters:  03/05/21 28.17 kg/m  08/23/19 28.45 kg/m  11/22/16 27.20 kg/m      Review of Systems  Constitutional:  Negative for diaphoresis.  Eyes:  Negative for pain.  Respiratory:  Negative for shortness of breath.   Cardiovascular:  Negative for chest pain, palpitations and leg swelling.  Gastrointestinal:  Negative for abdominal pain.  Endocrine: Negative for polydipsia.  Skin:  Negative for rash.  Neurological:  Negative for dizziness, weakness and headaches.  Hematological:  Does not bruise/bleed easily.  All other systems reviewed and are negative.     Objective:   Physical Exam Vitals and nursing note reviewed.  Constitutional:      Appearance: Normal appearance. He is well-developed.  HENT:     Head: Normocephalic.     Nose: Nose normal.  Eyes:     Pupils: Pupils are equal, round, and reactive to light.  Neck:     Thyroid: No thyroid mass or thyromegaly.     Vascular: No carotid bruit or JVD.     Trachea: Phonation normal.  Cardiovascular:     Rate and Rhythm: Normal rate and regular rhythm.  Pulmonary:     Effort: Pulmonary effort is normal. No respiratory distress.     Breath sounds: Normal breath sounds.   Abdominal:     General: Bowel sounds are normal.     Palpations: Abdomen is soft.     Tenderness: There is no abdominal tenderness.  Musculoskeletal:        General: Normal range of motion.     Cervical back: Normal range of motion and neck supple.  Lymphadenopathy:     Cervical: No cervical adenopathy.  Skin:    General: Skin is warm and dry.  Neurological:     Mental Status: He is alert and oriented to person, place, and time.  Psychiatric:        Behavior: Behavior normal.        Thought Content: Thought content normal.        Judgment: Judgment normal.      BP (!) 152/87   Pulse (!) 59   Temp 98.2 F (36.8 C) (Temporal)   Resp 20   Ht 5' 11"  (1.803 m)   Wt 202 lb (91.6 kg)   SpO2 97%   BMI 28.17 kg/m   EKG- sinus bradycardia-Preliminary reading by Ronnald Collum, FNP  [facility]     Assessment & Plan:   [name] comes in today with chief complaint of Chest Pain   Diagnosis and orders addressed:  1. Chest pain, unspecified type Keep diary of episodes - EKG 12-Lead -  CBC with Differential/Platelet - CMP14+EGFR - Lipid panel  2. Primary hypertension Low dash diet Keep diary of blood pressure - lisinopril (ZESTRIL) 20 MG tablet; Take 1 tablet (20 mg total) by mouth daily.  Dispense: 90 tablet; Refill: 1  3. Prostate cancer screening - PSA, total and free  4. Encounter for screening for colorectal malignant neoplasm - Ambulatory referral to Gastroenterology   Labs pending Health Maintenance reviewed Diet and exercise encouraged  Follow up plan: 6 months   Mary-Margaret Hassell Done, FNP

## 2021-03-05 NOTE — Patient Instructions (Signed)
https://www.nhlbi.nih.gov/files/docs/public/heart/dash_brief.pdf">  DASH Eating Plan DASH stands for Dietary Approaches to Stop Hypertension. The DASH eating plan is a healthy eating plan that has been shown to: Reduce high blood pressure (hypertension). Reduce your risk for type 2 diabetes, heart disease, and stroke. Help with weight loss. What are tips for following this plan? Reading food labels Check food labels for the amount of salt (sodium) per serving. Choose foods with less than 5 percent of the Daily Value of sodium. Generally, foods with less than 300 milligrams (mg) of sodium per serving fit into this eating plan. To find whole grains, look for the word "whole" as the first word in the ingredient list. Shopping Buy products labeled as "low-sodium" or "no salt added." Buy fresh foods. Avoid canned foods and pre-made or frozen meals. Cooking Avoid adding salt when cooking. Use salt-free seasonings or herbs instead of table salt or sea salt. Check with your health care provider or pharmacist before using salt substitutes. Do not fry foods. Cook foods using healthy methods such as baking, boiling, grilling, roasting, and broiling instead. Cook with heart-healthy oils, such as olive, canola, avocado, soybean, or sunflower oil. Meal planning  Eat a balanced diet that includes: 4 or more servings of fruits and 4 or more servings of vegetables each day. Try to fill one-half of your plate with fruits and vegetables. 6-8 servings of whole grains each day. Less than 6 oz (170 g) of lean meat, poultry, or fish each day. A 3-oz (85-g) serving of meat is about the same size as a deck of cards. One egg equals 1 oz (28 g). 2-3 servings of low-fat dairy each day. One serving is 1 cup (237 mL). 1 serving of nuts, seeds, or beans 5 times each week. 2-3 servings of heart-healthy fats. Healthy fats called omega-3 fatty acids are found in foods such as walnuts, flaxseeds, fortified milks, and eggs.  These fats are also found in cold-water fish, such as sardines, salmon, and mackerel. Limit how much you eat of: Canned or prepackaged foods. Food that is high in trans fat, such as some fried foods. Food that is high in saturated fat, such as fatty meat. Desserts and other sweets, sugary drinks, and other foods with added sugar. Full-fat dairy products. Do not salt foods before eating. Do not eat more than 4 egg yolks a week. Try to eat at least 2 vegetarian meals a week. Eat more home-cooked food and less restaurant, buffet, and fast food.  Lifestyle When eating at a restaurant, ask that your food be prepared with less salt or no salt, if possible. If you drink alcohol: Limit how much you use to: 0-1 drink a day for women who are not pregnant. 0-2 drinks a day for men. Be aware of how much alcohol is in your drink. In the [place] one drink equals one 12 oz bottle of beer (355 mL), one 5 oz glass of wine (148 mL), or one 1 oz glass of hard liquor (44 mL). General information Avoid eating more than 2,300 mg of salt a day. If you have hypertension, you may need to reduce your sodium intake to 1,500 mg a day. Work with your health care provider to maintain a healthy body weight or to lose weight. Ask what an ideal weight is for you. Get at least 30 minutes of exercise that causes your heart to beat faster (aerobic exercise) most days of the week. Activities may include walking, swimming, or biking. Work with your health care provider   or dietitian to adjust your eating plan to your individual calorie needs. What foods should I eat? Fruits All fresh, dried, or frozen fruit. Canned fruit in natural juice (without addedsugar). Vegetables Fresh or frozen vegetables (raw, steamed, roasted, or grilled). Low-sodium or reduced-sodium tomato and vegetable juice. Low-sodium or reduced-sodium tomatosauce and tomato paste. Low-sodium or reduced-sodium canned vegetables. Grains Whole-grain or  whole-wheat bread. Whole-grain or whole-wheat pasta. Brown rice. Oatmeal. Quinoa. Bulgur. Whole-grain and low-sodium cereals. Pita bread.Low-fat, low-sodium crackers. Whole-wheat flour tortillas. Meats and other proteins Skinless chicken or turkey. Ground chicken or turkey. Pork with fat trimmed off. Fish and seafood. Egg whites. Dried beans, peas, or lentils. Unsalted nuts, nut butters, and seeds. Unsalted canned beans. Lean cuts of beef with fat trimmed off. Low-sodium, lean precooked or cured meat, such as sausages or meatloaves. Dairy Low-fat (1%) or fat-free (skim) milk. Reduced-fat, low-fat, or fat-free cheeses. Nonfat, low-sodium ricotta or cottage cheese. Low-fat or nonfatyogurt. Low-fat, low-sodium cheese. Fats and oils Soft margarine without trans fats. Vegetable oil. Reduced-fat, low-fat, or light mayonnaise and salad dressings (reduced-sodium). Canola, safflower, olive, avocado, soybean, andsunflower oils. Avocado. Seasonings and condiments Herbs. Spices. Seasoning mixes without salt. Other foods Unsalted popcorn and pretzels. Fat-free sweets. The items listed above may not be a complete list of foods and beverages you can eat. Contact a dietitian for more information. What foods should I avoid? Fruits Canned fruit in a light or heavy syrup. Fried fruit. Fruit in cream or buttersauce. Vegetables Creamed or fried vegetables. Vegetables in a cheese sauce. Regular canned vegetables (not low-sodium or reduced-sodium). Regular canned tomato sauce and paste (not low-sodium or reduced-sodium). Regular tomato and vegetable juice(not low-sodium or reduced-sodium). Pickles. Olives. Grains Baked goods made with fat, such as croissants, muffins, or some breads. Drypasta or rice meal packs. Meats and other proteins Fatty cuts of meat. Ribs. Fried meat. Bacon. Bologna, salami, and other precooked or cured meats, such as sausages or meat loaves. Fat from the back of a pig (fatback). Bratwurst.  Salted nuts and seeds. Canned beans with added salt. Canned orsmoked fish. Whole eggs or egg yolks. Chicken or turkey with skin. Dairy Whole or 2% milk, cream, and half-and-half. Whole or full-fat cream cheese. Whole-fat or sweetened yogurt. Full-fat cheese. Nondairy creamers. Whippedtoppings. Processed cheese and cheese spreads. Fats and oils Butter. Stick margarine. Lard. Shortening. Ghee. Bacon fat. Tropical oils, suchas coconut, palm kernel, or palm oil. Seasonings and condiments Onion salt, garlic salt, seasoned salt, table salt, and sea salt. Worcestershire sauce. Tartar sauce. Barbecue sauce. Teriyaki sauce. Soy sauce, including reduced-sodium. Steak sauce. Canned and packaged gravies. Fish sauce. Oyster sauce. Cocktail sauce. Store-bought horseradish. Ketchup. Mustard. Meat flavorings and tenderizers. Bouillon cubes. Hot sauces. Pre-made or packaged marinades. Pre-made or packaged taco seasonings. Relishes. Regular saladdressings. Other foods Salted popcorn and pretzels. The items listed above may not be a complete list of foods and beverages you should avoid. Contact a dietitian for more information. Where to find more information National Heart, Lung, and Blood Institute: www.nhlbi.nih.gov American Heart Association: www.heart.org Academy of Nutrition and Dietetics: www.eatright.org National Kidney Foundation: www.kidney.org Summary The DASH eating plan is a healthy eating plan that has been shown to reduce high blood pressure (hypertension). It may also reduce your risk for type 2 diabetes, heart disease, and stroke. When on the DASH eating plan, aim to eat more fresh fruits and vegetables, whole grains, lean proteins, low-fat dairy, and heart-healthy fats. With the DASH eating plan, you should limit salt (sodium) intake to 2,300   mg a day. If you have hypertension, you may need to reduce your sodium intake to 1,500 mg a day. Work with your health care provider or dietitian to adjust  your eating plan to your individual calorie needs. This information is not intended to replace advice given to you by your health care provider. Make sure you discuss any questions you have with your healthcare provider. Document Revised: 06/14/2019 Document Reviewed: 06/14/2019 Elsevier Patient Education  2022 Elsevier Inc.  

## 2021-03-06 LAB — PSA, TOTAL AND FREE
PSA, Free Pct: 26.3 %
PSA, Free: 0.21 ng/mL
Prostate Specific Ag, Serum: 0.8 ng/mL (ref 0.0–4.0)

## 2021-03-06 LAB — CMP14+EGFR
ALT: 19 IU/L (ref 0–44)
AST: 22 IU/L (ref 0–40)
Albumin/Globulin Ratio: 2.8 — ABNORMAL HIGH (ref 1.2–2.2)
Albumin: 4.8 g/dL (ref 3.8–4.9)
Alkaline Phosphatase: 40 IU/L — ABNORMAL LOW (ref 44–121)
BUN/Creatinine Ratio: 16 (ref 9–20)
BUN: 17 mg/dL (ref 6–24)
Bilirubin Total: 0.5 mg/dL (ref 0.0–1.2)
CO2: 25 mmol/L (ref 20–29)
Calcium: 9.9 mg/dL (ref 8.7–10.2)
Chloride: 100 mmol/L (ref 96–106)
Creatinine, Ser: 1.09 mg/dL (ref 0.76–1.27)
Globulin, Total: 1.7 g/dL (ref 1.5–4.5)
Glucose: 105 mg/dL — ABNORMAL HIGH (ref 65–99)
Potassium: 5.2 mmol/L (ref 3.5–5.2)
Sodium: 140 mmol/L (ref 134–144)
Total Protein: 6.5 g/dL (ref 6.0–8.5)
eGFR: 82 mL/min/{1.73_m2} (ref >59)

## 2021-03-06 LAB — LIPID PANEL
Chol/HDL Ratio: 2.7 ratio (ref 0.0–5.0)
Cholesterol, Total: 115 mg/dL (ref 100–199)
HDL: 43 mg/dL (ref >39)
LDL Chol Calc (NIH): 61 mg/dL (ref 0–99)
Triglycerides: 42 mg/dL (ref 0–149)
VLDL Cholesterol Cal: 11 mg/dL (ref 5–40)

## 2021-03-06 LAB — CBC WITH DIFFERENTIAL/PLATELET
Basophils Absolute: 0 10*3/uL (ref 0.0–0.2)
Basos: 1 %
EOS (ABSOLUTE): 0.1 10*3/uL (ref 0.0–0.4)
Eos: 2 %
Hematocrit: 48.8 % (ref 37.5–51.0)
Hemoglobin: 16.3 g/dL (ref 13.0–17.7)
Immature Grans (Abs): 0 10*3/uL (ref 0.0–0.1)
Immature Granulocytes: 0 %
Lymphocytes Absolute: 1.5 10*3/uL (ref 0.7–3.1)
Lymphs: 22 %
MCH: 30.5 pg (ref 26.6–33.0)
MCHC: 33.4 g/dL (ref 31.5–35.7)
MCV: 91 fL (ref 79–97)
Monocytes Absolute: 0.6 10*3/uL (ref 0.1–0.9)
Monocytes: 8 %
Neutrophils Absolute: 4.6 10*3/uL (ref 1.4–7.0)
Neutrophils: 67 %
Platelets: 276 10*3/uL (ref 150–450)
RBC: 5.34 x10E6/uL (ref 4.14–5.80)
RDW: 12.5 % (ref 11.6–15.4)
WBC: 6.8 10*3/uL (ref 3.4–10.8)

## 2021-03-26 ENCOUNTER — Telehealth: Payer: Self-pay | Admitting: Nurse Practitioner

## 2021-03-26 NOTE — Telephone Encounter (Signed)
[facility] returning MMM call regarding patient. Please call back.

## 2021-08-12 ENCOUNTER — Encounter: Payer: Self-pay | Admitting: Gastroenterology

## 2021-08-25 ENCOUNTER — Ambulatory Visit (AMBULATORY_SURGERY_CENTER): Payer: Managed Care, Other (non HMO) | Admitting: *Deleted

## 2021-08-25 ENCOUNTER — Other Ambulatory Visit: Payer: Self-pay

## 2021-08-25 VITALS — Ht 71.0 in | Wt 200.0 lb

## 2021-08-25 DIAGNOSIS — Z1211 Encounter for screening for malignant neoplasm of colon: Secondary | ICD-10-CM

## 2021-08-25 MED ORDER — NA SULFATE-K SULFATE-MG SULF 17.5-3.13-1.6 GM/177ML PO SOLN
1.0000 | Freq: Once | ORAL | 0 refills | Status: AC
Start: 2021-08-25 — End: 2021-08-25

## 2021-08-25 NOTE — Progress Notes (Signed)

## 2021-09-07 ENCOUNTER — Encounter: Payer: Self-pay | Admitting: Gastroenterology

## 2021-09-09 ENCOUNTER — Ambulatory Visit (AMBULATORY_SURGERY_CENTER): Payer: Managed Care, Other (non HMO) | Admitting: Gastroenterology

## 2021-09-09 ENCOUNTER — Encounter: Payer: Self-pay | Admitting: Gastroenterology

## 2021-09-09 ENCOUNTER — Other Ambulatory Visit: Payer: Self-pay

## 2021-09-09 VITALS — BP 106/76 | HR 52 | Temp 98.0°F | Resp 17 | Ht 71.0 in | Wt 200.0 lb

## 2021-09-09 DIAGNOSIS — Z1211 Encounter for screening for malignant neoplasm of colon: Secondary | ICD-10-CM | POA: Diagnosis present

## 2021-09-09 MED ORDER — SODIUM CHLORIDE 0.9 % IV SOLN: 500.0000 mL | Freq: Once | INTRAVENOUS | Status: DC

## 2021-09-09 NOTE — Progress Notes (Signed)
To pacu, VSS. Report to [email or web address].tb 

## 2021-09-09 NOTE — Op Note (Signed)
[facility] Patient Name: Joel Gray Procedure Date: 09/09/2021 2:21 PM MRN: [id number] Endoscopist: Sherilyn Cooter L. Myrtie Neither , MD Age: 51 Referring MD:  Date of Birth: November 14, 1970 Gender: Male Account #: [id number] Procedure:                Colonoscopy Indications:              Screening for colorectal malignant neoplasm, This                            is the patient's first colonoscopy Medicines:                Monitored Anesthesia Care Procedure:                Pre-Anesthesia Assessment:                           - Prior to the procedure, a History and Physical                            was performed, and patient medications and                            allergies were reviewed. The patient's tolerance of                            previous anesthesia was also reviewed. The risks                            and benefits of the procedure and the sedation                            options and risks were discussed with the patient.                            All questions were answered, and informed consent                            was obtained. Prior Anticoagulants: The patient has                            taken no previous anticoagulant or antiplatelet                            agents. ASA Grade Assessment: II - A patient with                            mild systemic disease. After reviewing the risks                            and benefits, the patient was deemed in                            satisfactory condition to undergo the procedure.  After obtaining informed consent, the colonoscope                            was passed under direct vision. Throughout the                            procedure, the patient's blood pressure, pulse, and                            oxygen saturations were monitored continuously. The                            CF [id number] #[id number] was introduced through the anus                            and advanced to the the  terminal ileum, with                            identification of the appendiceal orifice and IC                            valve. The colonoscopy was performed without                            difficulty. The patient tolerated the procedure                            well. The quality of the bowel preparation was                            excellent. The terminal ileum, ileocecal valve,                            appendiceal orifice, and rectum were photographed. Scope In: 2:28:46 PM Scope Out: 2:42:21 PM Scope Withdrawal Time: 0 hours 10 minutes 48 seconds  Total Procedure Duration: 0 hours 13 minutes 35 seconds  Findings:                 The digital rectal exam findings include enlarged,                            soft prostate.                           The terminal ileum appeared normal.                           Repeat examination of right colon under NBI                            performed.                           Multiple diverticula were found in the left colon  and right colon (L>R).                           There is no endoscopic evidence of polyps in the                            entire colon.                           Internal hemorrhoids were found. The hemorrhoids                            were small.                           The exam was otherwise without abnormality on                            direct and retroflexion views. Complications:            No immediate complications. Estimated Blood Loss:     Estimated blood loss: none. Impression:               - Enlarged prostate found on digital rectal exam.                           - The examined portion of the ileum was normal.                           - Diverticulosis in the left colon and in the right                            colon.                           - Internal hemorrhoids.                           - The examination was otherwise normal on direct                             and retroflexion views.                           - No specimens collected. Recommendation:           - Patient has a contact number available for                            emergencies. The signs and symptoms of potential                            delayed complications were discussed with the                            patient. Return to normal activities tomorrow.  Written discharge instructions were provided to the                            patient.                           - Resume previous diet.                           - Continue present medications.                           - Repeat colonoscopy in 10 years for screening                            purposes. Brizza Nathanson L. Myrtie Neither, MD 09/09/2021 2:48:01 PM This report has been signed electronically.

## 2021-09-09 NOTE — Patient Instructions (Signed)
Thank you for letting us take care of your healthcare needs today. ?Please see handouts given to you on Hemorrhoids and Diverticulosis. ? ? ? ?YOU HAD AN ENDOSCOPIC PROCEDURE TODAY AT THE [facility]:   Refer to the procedure report that was given to you for any specific questions about what was found during the examination.  If the procedure report does not answer your questions, please call your gastroenterologist to clarify.  If you requested that your care partner not be given the details of your procedure findings, then the procedure report has been included in a sealed envelope for you to review at your convenience later. ? ?YOU SHOULD EXPECT: Some feelings of bloating in the abdomen. Passage of more gas than usual.  Walking can help get rid of the air that was put into your GI tract during the procedure and reduce the bloating. If you had a lower endoscopy (such as a colonoscopy or flexible sigmoidoscopy) you may notice spotting of blood in your stool or on the toilet paper. If you underwent a bowel prep for your procedure, you may not have a normal bowel movement for a few days. ? ?Please Note:  You might notice some irritation and congestion in your nose or some drainage.  This is from the oxygen used during your procedure.  There is no need for concern and it should clear up in a day or so. ? ?SYMPTOMS TO REPORT IMMEDIATELY: ? ?Following lower endoscopy (colonoscopy or flexible sigmoidoscopy): ? Excessive amounts of blood in the stool ? Significant tenderness or worsening of abdominal pains ? Swelling of the abdomen that is new, acute ? Fever of 100?F or higher ? ? ?For urgent or emergent issues, a gastroenterologist can be reached at any hour by calling [phone]. ?Do not use MyChart messaging for urgent concerns.  ? ? ?DIET:  We do recommend a small meal at first, but then you may proceed to your regular diet.  Drink plenty of fluids but you should avoid alcoholic beverages for 24  hours. ? ?ACTIVITY:  You should plan to take it easy for the rest of today and you should NOT DRIVE or use heavy machinery until tomorrow (because of the sedation medicines used during the test).   ? ?FOLLOW UP: ?Our staff will call the number listed on your records 48-72 hours following your procedure to check on you and address any questions or concerns that you may have regarding the information given to you following your procedure. If we do not reach you, we will leave a message.  We will attempt to reach you two times.  During this call, we will ask if you have developed any symptoms of COVID 19. If you develop any symptoms (ie: fever, flu-like symptoms, shortness of breath, cough etc.) before then, please call [phone].  If you test positive for Covid 19 in the 2 weeks post procedure, please call and report this information to us.   ? ?If any biopsies were taken you will be contacted by phone or by letter within the next 1-3 weeks.  Please call us at [phone] if you have not heard about the biopsies in 3 weeks.  ? ? ?SIGNATURES/CONFIDENTIALITY: ?You and/or your care partner have signed paperwork which will be entered into your electronic medical record.  These signatures attest to the fact that that the information above on your After Visit Summary has been reviewed and is understood.  Full responsibility of the confidentiality of this discharge information   with you and/or your care-partner.

## 2021-09-09 NOTE — Progress Notes (Signed)
History and Physical:  This patient presents for endoscopic testing for: Encounter Diagnosis  Name Primary?   Special screening for malignant neoplasms, colon Yes    First screening.  Patient denies chronic abdominal pain, rectal bleeding, constipation or diarrhea.   ROS: Patient denies chest pain or shortness of breath   Past Medical History: Past Medical History:  Diagnosis Date   Allergy    SEASONAL   GERD (gastroesophageal reflux disease)    Hypertension      Past Surgical History: Past Surgical History:  Procedure Laterality Date   LASIK     OVER 20 YEARS AGO   TONSILLECTOMY     AS A CHILD   VASECTOMY      Allergies: No Known Allergies  Outpatient Meds: Current Outpatient Medications  Medication Sig Dispense Refill   lisinopril (ZESTRIL) 20 MG tablet Take 1 tablet (20 mg total) by mouth daily. (Patient not taking: Reported on 08/25/2021) 90 tablet 1   Current Facility-Administered Medications  Medication Dose Route Frequency Provider Last Rate Last Admin   0.9 %  sodium chloride infusion  500 mL Intravenous Once Sherrilyn Rist, MD          ___________________________________________________________________ Objective   Exam:  BP (!) 154/80    Pulse 67    Temp 98 F (36.7 C) (Temporal)    Ht 5\' 11"  (1.803 m)    Wt 200 lb (90.7 kg)    SpO2 98%    BMI 27.89 kg/m   CV: RRR without murmur, S1/S2 Resp: clear to auscultation bilaterally, normal RR and effort noted GI: soft, no tenderness, with active bowel sounds.   Assessment: Encounter Diagnosis  Name Primary?   Special screening for malignant neoplasms, colon Yes     Plan: Colonoscopy  The benefits and risks of the planned procedure were described in detail with the patient or (when appropriate) their health care proxy.  Risks were outlined as including, but not limited to, bleeding, infection, perforation, adverse medication reaction leading to cardiac or pulmonary decompensation,  pancreatitis (if ERCP).  The limitation of incomplete mucosal visualization was also discussed.  No guarantees or warranties were given.    The patient is appropriate for an endoscopic procedure in the ambulatory setting.   - , MD

## 2021-09-09 NOTE — Progress Notes (Signed)
Pt's states no medical or surgical changes since previsit or office visit.  ° °VS DT °

## 2021-09-14 ENCOUNTER — Telehealth: Payer: Self-pay

## 2021-09-14 NOTE — Telephone Encounter (Signed)
°  Follow up Call-  Call back number 09/09/2021  Post procedure Call Back phone  # [phone]  Permission to leave phone message Yes  Some recent data might be hidden     Patient questions:  Do you have a fever, pain , or abdominal swelling? No. Pain Score  0 *  Have you tolerated food without any problems? Yes.    Have you been able to return to your normal activities? Yes.    Do you have any questions about your discharge instructions: Diet   No. Medications  No. Follow up visit  No.  Do you have questions or concerns about your Care? No.  Actions: * If pain score is 4 or above: No action needed, pain <4.

## 2022-07-13 ENCOUNTER — Other Ambulatory Visit: Payer: Self-pay | Admitting: Nurse Practitioner

## 2022-07-13 MED ORDER — OSELTAMIVIR PHOSPHATE 75 MG PO CAPS: 75.0000 mg | ORAL_CAPSULE | Freq: Every day | ORAL | 0 refills | Status: DC

## 2023-09-28 ENCOUNTER — Ambulatory Visit (INDEPENDENT_AMBULATORY_CARE_PROVIDER_SITE_OTHER): Payer: Managed Care, Other (non HMO) | Admitting: Nurse Practitioner

## 2023-09-28 ENCOUNTER — Ambulatory Visit (INDEPENDENT_AMBULATORY_CARE_PROVIDER_SITE_OTHER)

## 2023-09-28 ENCOUNTER — Encounter: Payer: Self-pay | Admitting: Nurse Practitioner

## 2023-09-28 VITALS — BP 153/90 | HR 61 | Temp 98.0°F | Ht 71.0 in | Wt 203.0 lb

## 2023-09-28 DIAGNOSIS — Z0000 Encounter for general adult medical examination without abnormal findings: Secondary | ICD-10-CM | POA: Diagnosis not present

## 2023-09-28 DIAGNOSIS — I10 Essential (primary) hypertension: Secondary | ICD-10-CM

## 2023-09-28 DIAGNOSIS — Z0001 Encounter for general adult medical examination with abnormal findings: Secondary | ICD-10-CM | POA: Diagnosis not present

## 2023-09-28 MED ORDER — LISINOPRIL 20 MG PO TABS
20.0000 mg | ORAL_TABLET | Freq: Every day | ORAL | 1 refills | Status: DC
Start: 2023-09-28 — End: 2024-04-01

## 2023-09-28 NOTE — Patient Instructions (Signed)

## 2023-09-28 NOTE — Progress Notes (Signed)
 Subjective:    Patient ID: Joel Gray, male    DOB: [birth year], 53 y.o.   MRN: [id number]   Chief Complaint: No chief complaint on file.    HPI:  Joel Gray is a 53 y.o. who identifies as a male who was assigned male at birth.   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 Annual physical exam He is here for annual physical. He has no current medical issues and is on no meds.  He use to be on hypertension meds but he d taking    New complaints: Having trouble falling asleep- only sleeps about 4 hours a night.  No Known Allergies Outpatient Encounter Medications as of 09/28/2023  Medication Sig   lisinopril (ZESTRIL) 20 MG tablet Take 1 tablet (20 mg total) by mouth daily. (Patient not taking: Reported on 08/25/2021)   oseltamivir (TAMIFLU) 75 MG capsule Take 1 capsule (75 mg total) by mouth daily.   No facility-administered encounter medications on file as of 09/28/2023.    Past Surgical History:  Procedure Laterality Date   LASIK     OVER 20 YEARS AGO   TONSILLECTOMY     AS A CHILD   VASECTOMY      Family History  Problem Relation Age of Onset   Heart disease Mother    Hyperlipidemia Mother    Hypertension Mother    Kidney disease Mother    Heart disease Father    Cancer Father    Hyperlipidemia Father    Diabetes Sister    Hyperlipidemia Brother    Colon cancer Neg Hx    Colon polyps Neg Hx    Esophageal cancer Neg Hx    Rectal cancer Neg Hx    Stomach cancer Neg Hx       Controlled substance contract: n/a     Review of Systems  Constitutional:  Negative for diaphoresis.  Eyes:  Negative for pain.  Respiratory:  Negative for shortness of breath.   Cardiovascular:  Negative for chest pain, palpitations and leg swelling.  Gastrointestinal:  Negative for abdominal pain.  Endocrine: Negative for polydipsia.  Skin:  Negative for rash.   Neurological:  Negative for dizziness, weakness and headaches.  Hematological:  Does not bruise/bleed easily.  All other systems reviewed and are negative.      Objective:   Physical Exam Vitals and nursing note reviewed.  Constitutional:      Appearance: Normal appearance. He is well-developed.  HENT:     Head: Normocephalic.     Nose: Nose normal.     Mouth/Throat:     Mouth: Mucous membranes are moist.     Pharynx: Oropharynx is clear.  Eyes:     Pupils: Pupils are equal, round, and reactive to light.  Neck:     Thyroid: No thyroid mass or thyromegaly.     Vascular: No carotid bruit or JVD.     Trachea: Phonation normal.  Cardiovascular:     Rate and Rhythm: Normal rate and regular rhythm.  Pulmonary:     Effort: Pulmonary effort is normal. No respiratory distress.     Breath sounds: Normal breath sounds.  Abdominal:     General: Bowel sounds are normal.     Palpations: Abdomen is soft.     Tenderness: There is no abdominal tenderness.  Musculoskeletal:        General: Normal range of  motion.     Cervical back: Normal range of motion and neck supple.  Lymphadenopathy:     Cervical: No cervical adenopathy.  Skin:    General: Skin is warm and dry.  Neurological:     Mental Status: He is alert and oriented to person, place, and time.  Psychiatric:        Behavior: Behavior normal.        Thought Content: Thought content normal.        Judgment: Judgment normal.       BP (!) 153/90   Pulse 61   Temp 98 F (36.7 C) (Temporal)   Ht 5\' 11"  (1.803 m)   Wt 203 lb (92.1 kg)   SpO2 97%   BMI 28.31 kg/m '  EKG- [facility] [place], [place]     Assessment & Plan:   Norina Buzzard in today with chief complaint of Annual Exam   1. Annual physical exam (Primary) - CBC with Differential/Platelet - CMP14+EGFR - Lipid panel - Thyroid Panel With TSH - Vitamin B12 - VITAMIN D 25 Hydroxy (Vit-D Deficiency, Fractures) - PSA, total and free -  Testosterone,Free and Total - DG Chest 2 View - EKG 12-Lead  2. Primary hypertension Low sodium diet Back on lisinopril Start keeping diary of blood pressure at home. - lisinopril (ZESTRIL) 20 MG tablet; Take 1 tablet (20 mg total) by mouth daily.  Dispense: 90 tablet; Refill: 1    The above assessment and management plan was discussed with the patient. The patient verbalized understanding of and has agreed to the management plan. Patient is aware to call the clinic if symptoms persist or worsen. Patient is aware when to return to the clinic for a follow-up visit. Patient educated on when it is appropriate to go to the emergency department.   Mary-Margaret Daphine Deutscher, FNP

## 2023-10-03 LAB — CBC WITH DIFFERENTIAL/PLATELET
Basophils Absolute: 0 10*3/uL (ref 0.0–0.2)
Basos: 1 %
EOS (ABSOLUTE): 0.2 10*3/uL (ref 0.0–0.4)
Eos: 3 %
Hematocrit: 49.8 % (ref 37.5–51.0)
Hemoglobin: 16.6 g/dL (ref 13.0–17.7)
Immature Grans (Abs): 0 10*3/uL (ref 0.0–0.1)
Immature Granulocytes: 0 %
Lymphocytes Absolute: 1.3 10*3/uL (ref 0.7–3.1)
Lymphs: 23 %
MCH: 29.6 pg (ref 26.6–33.0)
MCHC: 33.3 g/dL (ref 31.5–35.7)
MCV: 89 fL (ref 79–97)
Monocytes Absolute: 0.5 10*3/uL (ref 0.1–0.9)
Monocytes: 9 %
Neutrophils Absolute: 3.6 10*3/uL (ref 1.4–7.0)
Neutrophils: 64 %
Platelets: 270 10*3/uL (ref 150–450)
RBC: 5.61 x10E6/uL (ref 4.14–5.80)
RDW: 12.2 % (ref 11.6–15.4)
WBC: 5.6 10*3/uL (ref 3.4–10.8)

## 2023-10-03 LAB — CMP14+EGFR
ALT: 21 IU/L (ref 0–44)
AST: 19 IU/L (ref 0–40)
Albumin: 4.6 g/dL (ref 3.8–4.9)
Alkaline Phosphatase: 42 IU/L — ABNORMAL LOW (ref 44–121)
BUN/Creatinine Ratio: 19 (ref 9–20)
BUN: 21 mg/dL (ref 6–24)
Bilirubin Total: 0.7 mg/dL (ref 0.0–1.2)
CO2: 24 mmol/L (ref 20–29)
Calcium: 9.8 mg/dL (ref 8.7–10.2)
Chloride: 102 mmol/L (ref 96–106)
Creatinine, Ser: 1.11 mg/dL (ref 0.76–1.27)
Globulin, Total: 2.1 g/dL (ref 1.5–4.5)
Glucose: 101 mg/dL — ABNORMAL HIGH (ref 70–99)
Potassium: 4.5 mmol/L (ref 3.5–5.2)
Sodium: 139 mmol/L (ref 134–144)
Total Protein: 6.7 g/dL (ref 6.0–8.5)
eGFR: 79 mL/min/{1.73_m2} (ref >59)

## 2023-10-03 LAB — THYROID PANEL WITH TSH
Free Thyroxine Index: 2.5 (ref 1.2–4.9)
T3 Uptake Ratio: 30 % (ref 24–39)
T4, Total: 8.3 ug/dL (ref 4.5–12.0)
TSH: 0.999 u[IU]/mL (ref 0.450–4.500)

## 2023-10-03 LAB — TESTOSTERONE,FREE AND TOTAL
Testosterone, Free: 10.7 pg/mL (ref 7.2–24.0)
Testosterone: 338 ng/dL (ref 264–916)

## 2023-10-03 LAB — LIPID PANEL
Chol/HDL Ratio: 2.8 ratio (ref 0.0–5.0)
Cholesterol, Total: 111 mg/dL (ref 100–199)
HDL: 40 mg/dL (ref >39)
LDL Chol Calc (NIH): 60 mg/dL (ref 0–99)
Triglycerides: 42 mg/dL (ref 0–149)
VLDL Cholesterol Cal: 11 mg/dL (ref 5–40)

## 2023-10-03 LAB — VITAMIN D 25 HYDROXY (VIT D DEFICIENCY, FRACTURES): Vit D, 25-Hydroxy: 45.8 ng/mL (ref 30.0–100.0)

## 2023-10-03 LAB — PSA, TOTAL AND FREE
PSA, Free Pct: 28.9 %
PSA, Free: 0.26 ng/mL
Prostate Specific Ag, Serum: 0.9 ng/mL (ref 0.0–4.0)

## 2023-10-03 LAB — VITAMIN B12: Vitamin B-12: 414 pg/mL (ref 232–1245)

## 2023-11-01 ENCOUNTER — Encounter: Payer: Self-pay | Admitting: Nurse Practitioner

## 2023-11-01 ENCOUNTER — Other Ambulatory Visit: Payer: Self-pay | Admitting: Nurse Practitioner

## 2023-11-01 MED ORDER — TESTOSTERONE CYPIONATE 100 MG/ML IM SOLN: 100.0000 mg | INTRAMUSCULAR | 5 refills | Status: DC

## 2023-11-01 NOTE — Progress Notes (Signed)
 Meds ordered this encounter  Medications   DISCONTD: testosterone cypionate (DEPOTESTOTERONE CYPIONATE) 100 MG/ML injection    Sig: Inject 1 mL (100 mg total) into the muscle every 14 (fourteen) days. For IM use only    Dispense:  10 mL    Refill:  5    Supervising Provider:   Arville Care A [[id number]]   testosterone cypionate (DEPOTESTOTERONE CYPIONATE) 100 MG/ML injection    Sig: Inject 1 mL (100 mg total) into the muscle every 14 (fourteen) days. For IM use only    Dispense:  10 mL    Refill:  5    Supervising Provider:   Arville Care A [[id number]]   Mary-Margaret Daphine Deutscher, FNP

## 2024-03-30 ENCOUNTER — Other Ambulatory Visit: Payer: Self-pay | Admitting: Nurse Practitioner

## 2024-03-30 DIAGNOSIS — I10 Essential (primary) hypertension: Secondary | ICD-10-CM

## 2024-05-03 ENCOUNTER — Other Ambulatory Visit: Payer: Self-pay | Admitting: Nurse Practitioner

## 2024-10-04 ENCOUNTER — Encounter: Payer: Self-pay | Admitting: Nurse Practitioner
